# Patient Record
Sex: Male | Born: 1995 | Race: Black or African American | Hispanic: No | Marital: Single | Smoking: Never smoker
Health system: Southern US, Community
[De-identification: ages and names within clinical notes are randomized; demographics above are authoritative.]

---
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
## 2015-07-30 IMAGING — CR DG FINGER INDEX 2+V*L*
1 series · 1 of 1 positions shown · non-contrast
Comparison: None.

CLINICAL DATA: Finger injury between 2 pieces of [place].

EXAM:
LEFT INDEX FINGER 2+V

[PA]
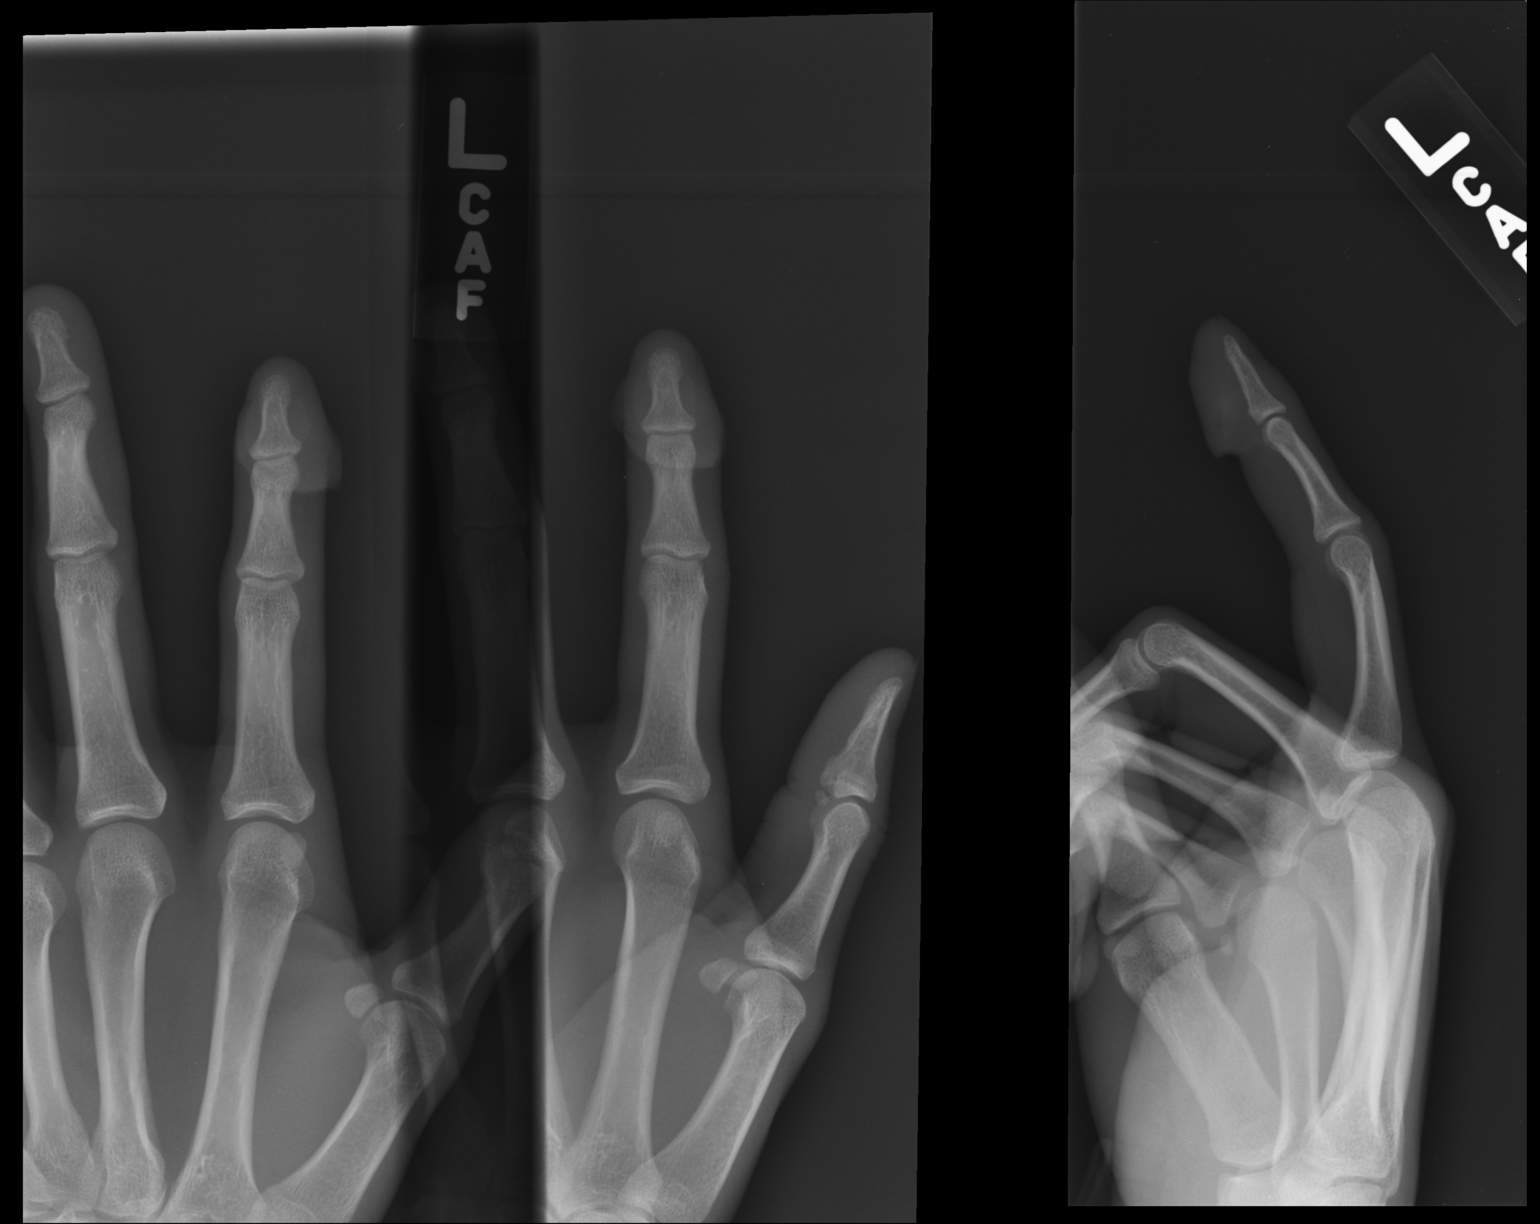

[1 of 1 positions shown; findings below may reference images not displayed]

FINDINGS: Abnormal soft tissue swelling and possible laceration flap noted
anterior to the distal phalanx. No foreign body or fracture is
observed. No dislocation identified.
IMPRESSION: 1. Soft tissue injury volar to the distal phalanx. No bony
abnormality observed.
